# Patient Record
Sex: Male | Born: 1985 | Race: White | Hispanic: No | Marital: Married
Health system: Southern US, Community
[De-identification: ages and names within clinical notes are randomized; demographics above are authoritative.]

## PROBLEM LIST (undated history)

## (undated) DIAGNOSIS — E119 Type 2 diabetes mellitus without complications: Secondary | ICD-10-CM

---

## 2022-03-03 ENCOUNTER — Encounter (HOSPITAL_COMMUNITY): Payer: Self-pay

## 2022-03-03 ENCOUNTER — Other Ambulatory Visit: Payer: Self-pay

## 2022-03-03 ENCOUNTER — Emergency Department (HOSPITAL_COMMUNITY): Payer: 59

## 2022-03-03 ENCOUNTER — Emergency Department (HOSPITAL_COMMUNITY)
Admission: EM | Admit: 2022-03-03 | Discharge: 2022-03-03 | Disposition: A | Discharge status: Home / Self Care | Payer: 59 | Attending: Emergency Medicine | Admitting: Emergency Medicine

## 2022-03-03 DIAGNOSIS — N200 Calculus of kidney: Secondary | ICD-10-CM | POA: Diagnosis not present

## 2022-03-03 DIAGNOSIS — E119 Type 2 diabetes mellitus without complications: Secondary | ICD-10-CM | POA: Diagnosis not present

## 2022-03-03 DIAGNOSIS — R1031 Right lower quadrant pain: Secondary | ICD-10-CM | POA: Diagnosis present

## 2022-03-03 HISTORY — DX: Type 2 diabetes mellitus without complications: E11.9

## 2022-03-03 LAB — CBC WITH DIFFERENTIAL/PLATELET
Abs Immature Granulocytes: 0.02 10*3/uL (ref 0.00–0.07)
Basophils Absolute: 0 10*3/uL (ref 0.0–0.1)
Basophils Relative: 0 %
Eosinophils Absolute: 0.2 10*3/uL (ref 0.0–0.5)
Eosinophils Relative: 2 %
HCT: 45.4 % (ref 39.0–52.0)
Hemoglobin: 16.4 g/dL (ref 13.0–17.0)
Immature Granulocytes: 0 %
Lymphocytes Relative: 41 %
Lymphs Abs: 3.7 10*3/uL (ref 0.7–4.0)
MCH: 30.8 pg (ref 26.0–34.0)
MCHC: 36.1 g/dL — ABNORMAL HIGH (ref 30.0–36.0)
MCV: 85.2 fL (ref 80.0–100.0)
Monocytes Absolute: 0.8 10*3/uL (ref 0.1–1.0)
Monocytes Relative: 9 %
Neutro Abs: 4.3 10*3/uL (ref 1.7–7.7)
Neutrophils Relative %: 48 %
Platelets: 237 10*3/uL (ref 150–400)
RBC: 5.33 MIL/uL (ref 4.22–5.81)
RDW: 12.1 % (ref 11.5–15.5)
WBC: 9.1 10*3/uL (ref 4.0–10.5)
nRBC: 0 % (ref 0.0–0.2)

## 2022-03-03 LAB — COMPREHENSIVE METABOLIC PANEL
ALT: 62 U/L — ABNORMAL HIGH (ref 0–44)
AST: 44 U/L — ABNORMAL HIGH (ref 15–41)
Albumin: 4.4 g/dL (ref 3.5–5.0)
Alkaline Phosphatase: 74 U/L (ref 38–126)
Anion gap: 9 (ref 5–15)
BUN: 14 mg/dL (ref 6–20)
CO2: 25 mmol/L (ref 22–32)
Calcium: 9.5 mg/dL (ref 8.9–10.3)
Chloride: 107 mmol/L (ref 98–111)
Creatinine, Ser: 0.9 mg/dL (ref 0.61–1.24)
GFR, Estimated: >60 mL/min (ref >60)
Glucose, Bld: 171 mg/dL — ABNORMAL HIGH (ref 70–99)
Potassium: 4.1 mmol/L (ref 3.5–5.1)
Sodium: 141 mmol/L (ref 135–145)
Total Bilirubin: 0.8 mg/dL (ref 0.3–1.2)
Total Protein: 7.4 g/dL (ref 6.5–8.1)

## 2022-03-03 LAB — URINALYSIS, ROUTINE W REFLEX MICROSCOPIC
Bilirubin Urine: NEGATIVE
Glucose, UA: NEGATIVE mg/dL
Ketones, ur: NEGATIVE mg/dL
Leukocytes,Ua: NEGATIVE
Nitrite: NEGATIVE
Protein, ur: NEGATIVE mg/dL
Specific Gravity, Urine: 1.019 (ref 1.005–1.030)
pH: 5 (ref 5.0–8.0)

## 2022-03-03 LAB — LIPASE, BLOOD: Lipase: 44 U/L (ref 11–51)

## 2022-03-03 MED ORDER — ONDANSETRON HCL 4 MG/2ML IJ SOLN
4.0000 mg | Freq: Once | INTRAMUSCULAR | Status: AC
  Administered 2022-03-03: 4 mg via INTRAVENOUS
  Filled 2022-03-03: qty 2

## 2022-03-03 MED ORDER — HYDROMORPHONE HCL 1 MG/ML IJ SOLN
1.0000 mg | Freq: Once | INTRAMUSCULAR | Status: AC
  Administered 2022-03-03: 1 mg via INTRAVENOUS
  Filled 2022-03-03: qty 1

## 2022-03-03 MED ORDER — OXYCODONE-ACETAMINOPHEN 5-325 MG PO TABS
1.0000 | ORAL_TABLET | Freq: Four times a day (QID) | ORAL | 0 refills | Status: AC | PRN
Start: 2022-03-03 — End: ?

## 2022-03-03 MED ORDER — TAMSULOSIN HCL 0.4 MG PO CAPS: 0.4000 mg | ORAL_CAPSULE | Freq: Every day | ORAL | 0 refills | Status: AC

## 2022-03-03 MED ORDER — KETOROLAC TROMETHAMINE 15 MG/ML IJ SOLN
15.0000 mg | Freq: Once | INTRAMUSCULAR | Status: AC
  Administered 2022-03-03: 15 mg via INTRAVENOUS
  Filled 2022-03-03: qty 1

## 2022-03-03 MED ORDER — SODIUM CHLORIDE 0.9 % IV BOLUS
1000.0000 mL | Freq: Once | INTRAVENOUS | Status: AC
  Administered 2022-03-03: 1000 mL via INTRAVENOUS

## 2022-03-03 NOTE — Discharge Instructions (Signed)
You have a 3 mm stone in your right ureter.  Drink plenty of fluids.  Please call urology for follow-up in the next week.   ? ?Get rechecked immediately if you develop fevers, inability to urinate, uncontrolled pain or severe pain on both sides. ?

## 2022-03-03 NOTE — ED Triage Notes (Signed)
Patient having lower right abdominal pain radiating to his back. Vomiting along with it.  ?

## 2022-03-03 NOTE — ED Notes (Signed)
Pt states understanding of dc instructions, importance of follow up, and prescriptions. Pt denies questions or concerns upon dc. Pt declined wheelchair assistance upon dc. Pt ambulated out of ed w/ steady gait. No belongings left in room upon dc.  

## 2022-03-03 NOTE — ED Provider Notes (Signed)
?[facility]-EMERGENCY DEPT ?Provider Note ? ? ?CSN: [id number] ?Arrival date & time: 03/03/22  0342 ? ?  ? ?History ? ?Chief Complaint  ?Patient presents with  ? Abdominal Pain  ? ? ?Thomas Richmond is a 36 y.o. male. ? ?The history is provided by the patient.  ?Abdominal Pain ?Thomas ServeMatthew Richmond is a 36 y.o. male who presents to the Emergency Department complaining of abdominal pain.  He presents to the ED for evaluation of sudden onset severe, constant RLQ abdominal pain that started just prior to ED arrival.  Pain radiates to his low back and he has associated N/V. No fevers. No prior similar sxs.  Has a hx/o DM.   ?  ? ?Home Medications ?Prior to Admission medications   ?Medication Sig Start Date End Date Taking? Authorizing Provider  ?oxyCODONE-acetaminophen (PERCOCET/ROXICET) 5-325 MG tablet Take 1 tablet by mouth every 6 (six) hours as needed for severe pain. 03/03/22  Yes Tilden Fossaees, Custer Pimenta, MD  ?tamsulosin (FLOMAX) 0.4 MG CAPS capsule Take 1 capsule (0.4 mg total) by mouth daily. 03/03/22  Yes Tilden Fossaees, Larinda Herter, MD  ?   ? ?Allergies    ?Patient has no known allergies.   ? ?Review of Systems   ?Review of Systems  ?Gastrointestinal:  Positive for abdominal pain.  ?All other systems reviewed and are negative. ? ?Physical Exam ?Updated Vital Signs ?BP 123/68   Pulse 80   Temp (!) 97.2 ?F (36.2 ?C) (Oral)   Resp 18   Ht 5\' 9"  (1.753 m)   Wt 118.4 kg   SpO2 92%   BMI 38.54 kg/m?  ?Physical Exam ?Vitals and nursing note reviewed.  ?Constitutional:   ?   General: He is in acute distress.  ?   Appearance: He is well-developed.  ?HENT:  ?   Head: Normocephalic and atraumatic.  ?Cardiovascular:  ?   Rate and Rhythm: Normal rate and regular rhythm.  ?Pulmonary:  ?   Effort: Pulmonary effort is normal. No respiratory distress.  ?Abdominal:  ?   Palpations: Abdomen is soft.  ?   Tenderness: There is no abdominal tenderness. There is no guarding or rebound.  ?Musculoskeletal:     ?   General: No tenderness.   ?Skin: ?   General: Skin is warm and dry.  ?Neurological:  ?   Mental Status: He is alert and oriented to person, place, and time.  ?Psychiatric:     ?   Behavior: Behavior normal.  ? ? ?ED Results / Procedures / Treatments   ?Labs ?(all labs ordered are listed, but only abnormal results are displayed) ?Labs Reviewed  ?COMPREHENSIVE METABOLIC PANEL - Abnormal; Notable for the following components:  ?    Result Value  ? Glucose, Bld 171 (*)   ? AST 44 (*)   ? ALT 62 (*)   ? All other components within normal limits  ?CBC WITH DIFFERENTIAL/PLATELET - Abnormal; Notable for the following components:  ? MCHC 36.1 (*)   ? All other components within normal limits  ?URINALYSIS, ROUTINE W REFLEX MICROSCOPIC - Abnormal; Notable for the following components:  ? Hgb urine dipstick LARGE (*)   ? Bacteria, UA RARE (*)   ? All other components within normal limits  ?LIPASE, BLOOD  ? ? ?EKG ?None ? ?Radiology ?CT Renal Stone Study ? ?Result Date: 03/03/2022 ?CLINICAL DATA:  36 year old male with history of left-sided flank pain. EXAM: CT ABDOMEN AND PELVIS WITHOUT CONTRAST TECHNIQUE: Multidetector CT imaging of the abdomen and pelvis was performed following  the standard protocol without IV contrast. RADIATION DOSE REDUCTION: This exam was performed according to the departmental dose-optimization program which includes automated exposure control, adjustment of the mA and/or kV according to patient size and/or use of iterative reconstruction technique. COMPARISON:  No priors. FINDINGS: Lower chest: Unremarkable. Hepatobiliary: Diffuse low attenuation throughout the hepatic parenchyma, indicative of a background of hepatic steatosis. No suspicious cystic or solid hepatic lesions are confidently identified on today's noncontrast CT examination. Unenhanced appearance of the gallbladder is normal. Pancreas: No definite pancreatic mass or peripancreatic fluid collections or inflammatory changes are noted on today's noncontrast CT  examination. Spleen: Unremarkable. Adrenals/Urinary Tract: Multiple tiny nonobstructive calculi are noted within the collecting systems of both kidneys measuring 4 mm or less in size. In addition, in the distal third of the right ureter (axial image 79 of series 2) shortly before the right ureterovesicular junction there is a 3 mm calculus. This is associated with only minimal fullness of the proximal right ureter, without frank hydroureteronephrosis. Unenhanced appearance of the kidneys, bilateral adrenal glands and urinary bladder is otherwise normal. Stomach/Bowel: Unenhanced appearance of the stomach is normal. There is no pathologic dilatation of small bowel or colon. Normal appendix. Vascular/Lymphatic: No atherosclerotic calcifications are noted in the abdominal aorta or pelvic vasculature. No lymphadenopathy noted in the abdomen or pelvis. Reproductive: Prostate gland and seminal vesicles are unremarkable in appearance. Other: No significant volume of ascites.  No pneumoperitoneum. Musculoskeletal: There are no aggressive appearing lytic or blastic lesions noted in the visualized portions of the skeleton. IMPRESSION: 1. 3 mm calculus in the distal third of the right ureter shortly before the right ureterovesicular junction with only mild proximal fullness in the right ureter, indicating very mild obstruction at this time. 2. Multiple nonobstructive calculi measuring 4 mm or less in size in the collecting systems of both kidneys. 3. Hepatic steatosis. Electronically Signed   By: Trudie Reed M.D.   On: 03/03/2022 05:50   ? ?Procedures ?Procedures  ? ? ?Medications Ordered in ED ?Medications  ?sodium chloride 0.9 % bolus 1,000 mL (0 mLs Intravenous Stopped 03/03/22 0631)  ?ketorolac (TORADOL) 15 MG/ML injection 15 mg (15 mg Intravenous Given 03/03/22 0415)  ?ondansetron [facility]) injection 4 mg (4 mg Intravenous Given 03/03/22 0414)  ?HYDROmorphone (DILAUDID) injection 1 mg (1 mg Intravenous Given 03/03/22 0418)   ?HYDROmorphone (DILAUDID) injection 1 mg (1 mg Intravenous Given 03/03/22 0531)  ? ? ?ED Course/ Medical Decision Making/ A&P ?  ?                        ?Medical Decision Making ?Amount and/or Complexity of Data Reviewed ?Labs: ordered. ?Radiology: ordered. ? ?Risk ?Prescription drug management. ? ? ?Patient here for evaluation of acute onset right lower quadrant pain rating to the back.  Patient in distress on ED arrival, appears severely uncomfortable.  He was treated with Toradol for possible renal colic without significant improvement in his pain.  He was also treated with multiple doses of Dilaudid.  Labs with normal renal function.  UA not consistent with UTI.  CT significant for right ureteral stone as well as multiple nonobstructing stones bilaterally.  On reassessment after medications in the emergency department patient's pain is controlled and he is feeling improved.  Discussed findings of studies.  Discussed home care for renal colic.  Discussed urology follow-up as well as return precautions. ? ?Doubt acute infection, sepsis, torsion, dissection. ? ? ? ? ? ? ? ?Final Clinical Impression(s) /  ED Diagnoses ?Final diagnoses:  ?Kidney stone on right side  ? ? ?Rx / DC Orders ?ED Discharge Orders   ? ?      Ordered  ?  tamsulosin (FLOMAX) 0.4 MG CAPS capsule  Daily       ? 03/03/22 0629  ?  oxyCODONE-acetaminophen (PERCOCET/ROXICET) 5-325 MG tablet  Every 6 hours PRN       ? 03/03/22 4818  ? ?  ?  ? ?  ? ? ?  ?Tilden Fossa, MD ?03/03/22 [phone] ? ?

## 2023-03-02 IMAGING — CT CT RENAL STONE PROTOCOL
2 of 4 series · 16 of 46 positions shown, 18 images · non-contrast
Comparison: No priors.

CLINICAL DATA: 35-year-old male with history of left-sided flank
pain.



[Series 2: axial st · axial · 0.83mm/px · z∈[+1104,+1529]mm · 13 of 97 slices shown, 15 images]
[im 6/97  soft-tissue]
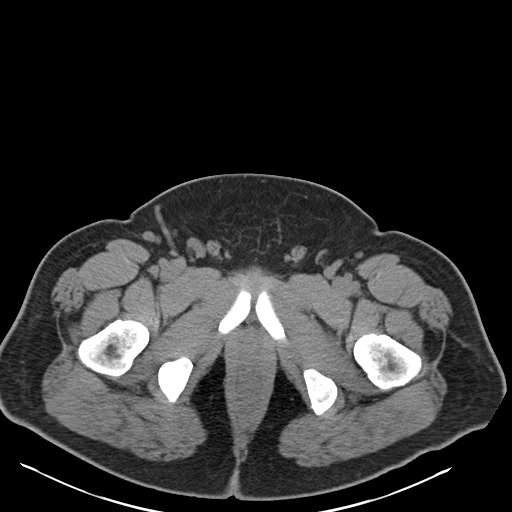
[im 6/97  bone]
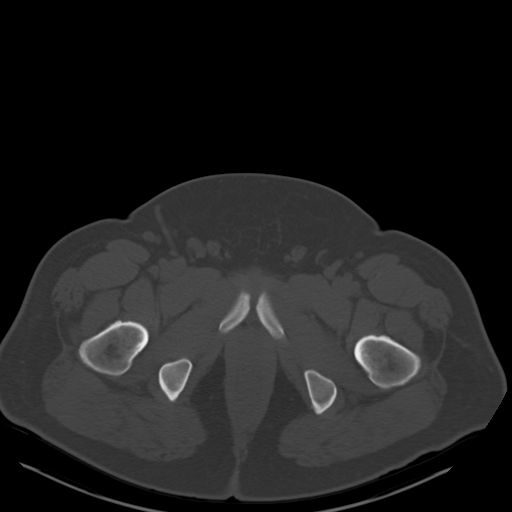
[im 16/97  soft-tissue]
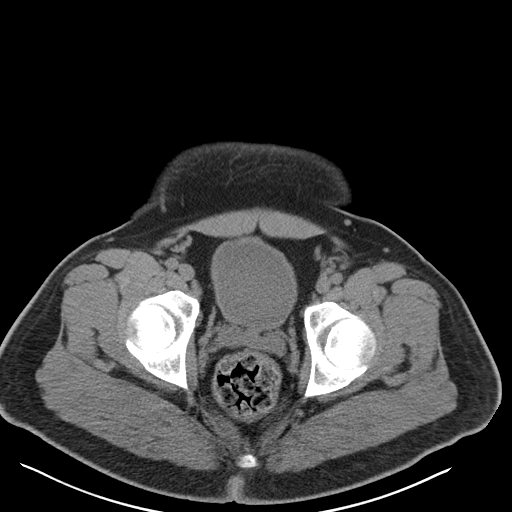
[im 21/97  soft-tissue]
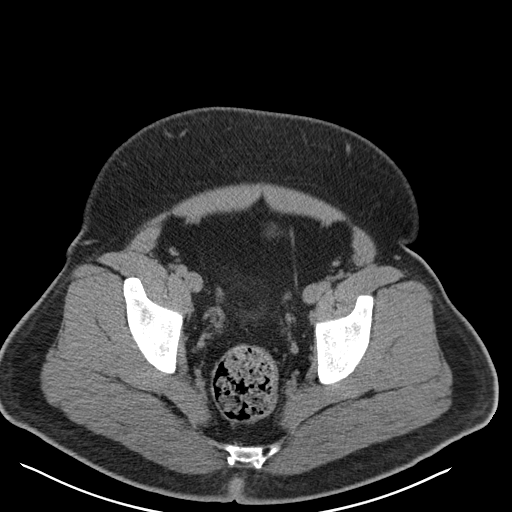
[im 26/97  soft-tissue]
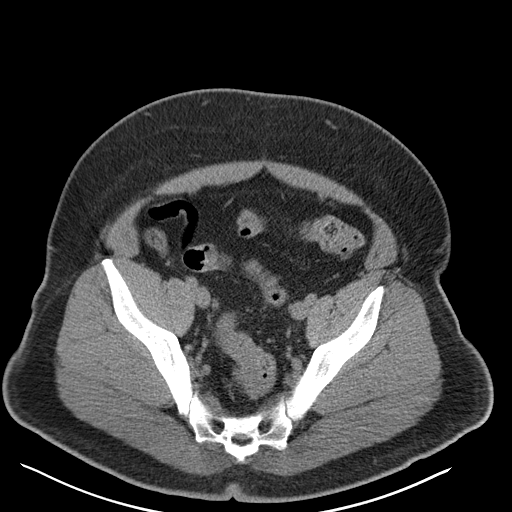
[im 36/97  soft-tissue]
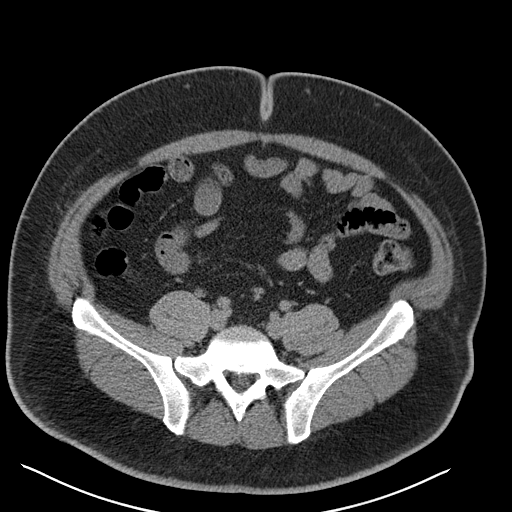
[im 41/97  soft-tissue]
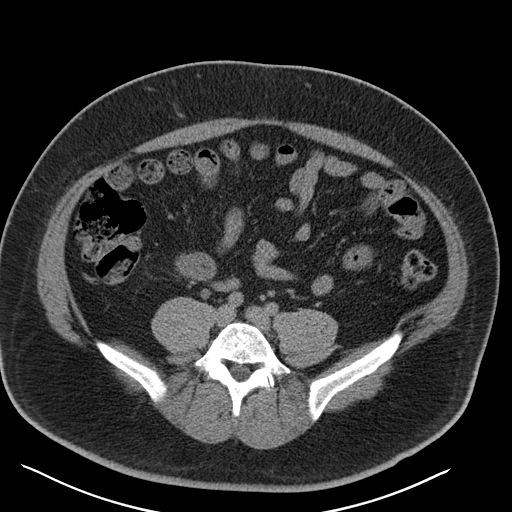
[im 51/97  soft-tissue]
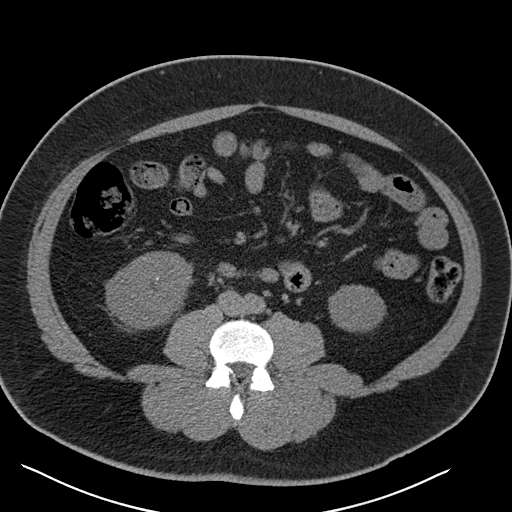
[im 56/97  soft-tissue]
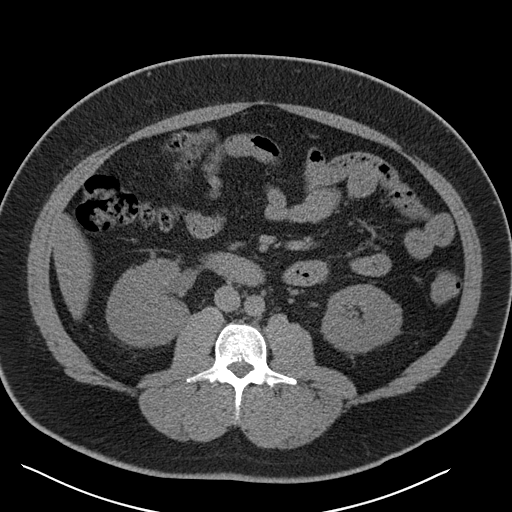
[im 61/97  soft-tissue]
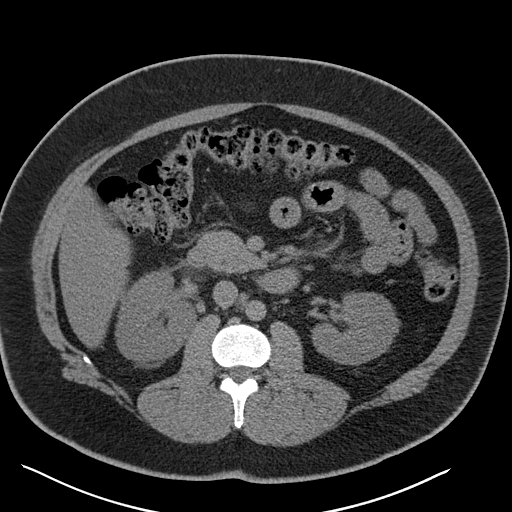
[im 61/97  bone]
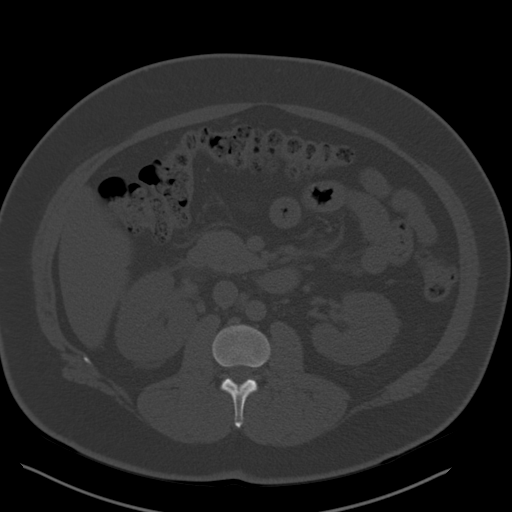
[im 71/97  soft-tissue]
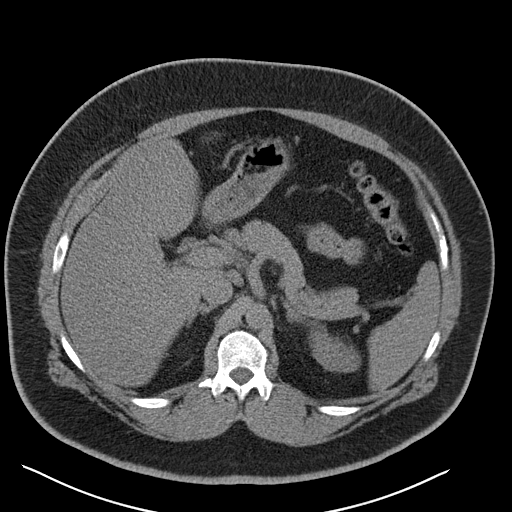
[im 76/97  soft-tissue]
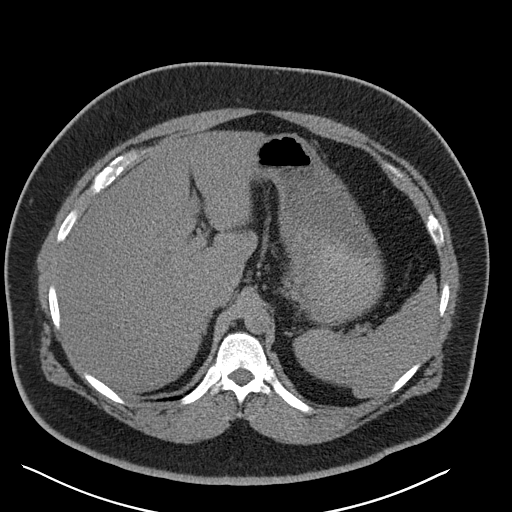
[im 81/97  soft-tissue]
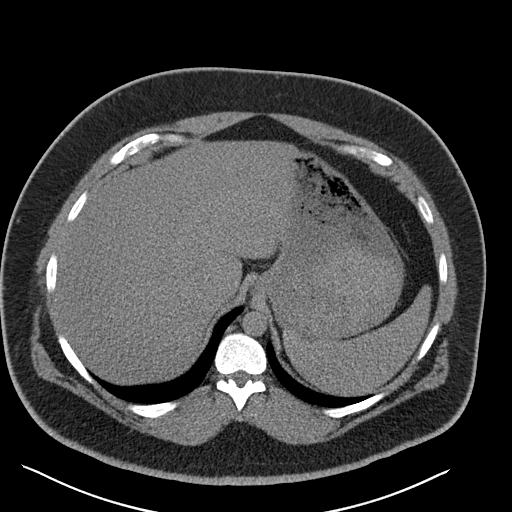
[im 91/97  soft-tissue]
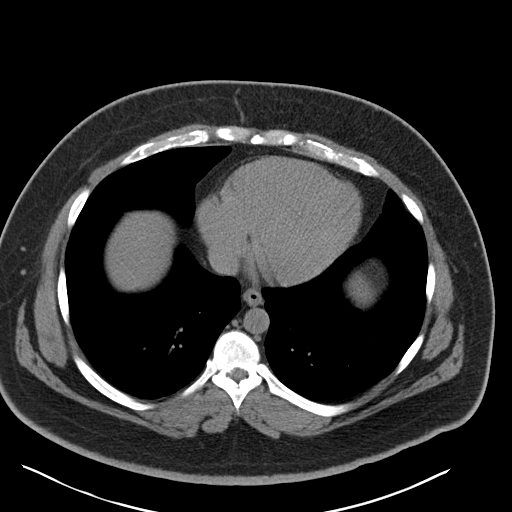

[Series 4: coronal · coronal · 0.81mm/px · 3 of 178 slices shown]
[im 60/178  soft-tissue]
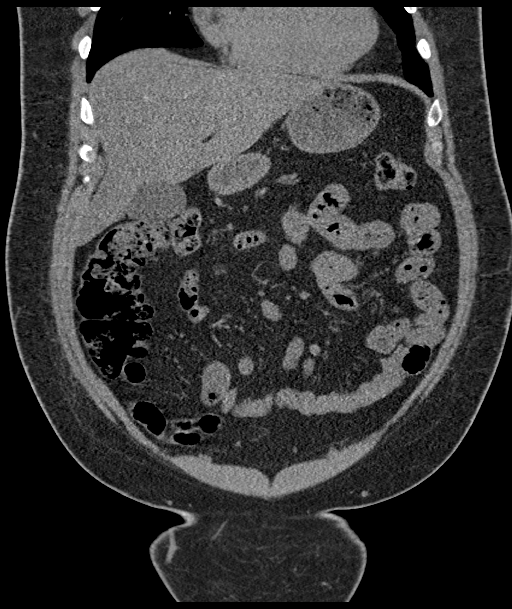
[im 79/178  soft-tissue]
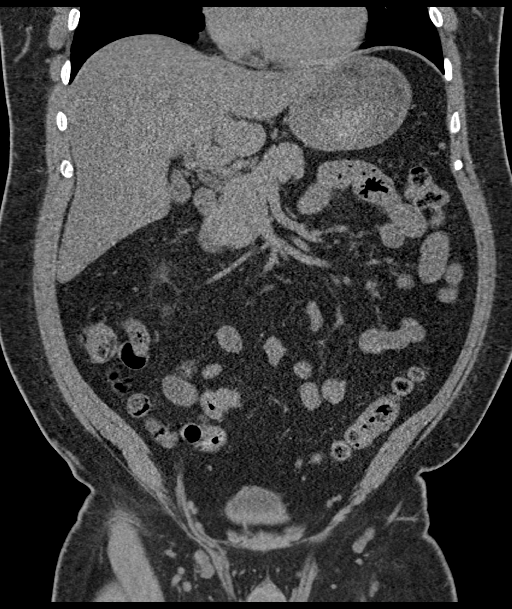
[im 99/178  soft-tissue]
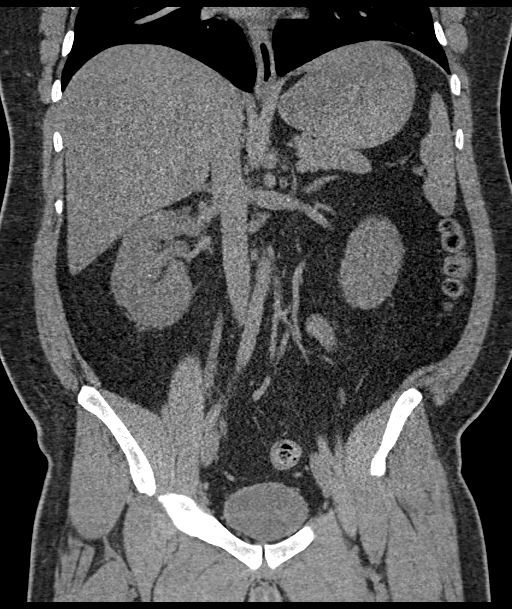

[16 of 46 positions shown; findings below may reference images not displayed]

FINDINGS: Lower chest: Unremarkable.

Hepatobiliary: Diffuse low attenuation throughout the hepatic
parenchyma, indicative of a background of hepatic steatosis. No
suspicious cystic or solid hepatic lesions are confidently
identified on today's noncontrast CT examination. Unenhanced
appearance of the gallbladder is normal.

Pancreas: No definite pancreatic mass or peripancreatic fluid
collections or inflammatory changes are noted on today's noncontrast
CT examination.

Spleen: Unremarkable.

Adrenals/Urinary Tract: Multiple tiny nonobstructive calculi are
noted within the collecting systems of both kidneys measuring 4 mm
or less in size. In addition, in the distal third of the right
ureter (axial image 79 of series 2) shortly before the right
ureterovesicular junction there is a 3 mm calculus. This is
associated with only minimal fullness of the proximal right ureter,
without frank hydroureteronephrosis. Unenhanced appearance of the
kidneys, bilateral adrenal glands and urinary bladder is otherwise
normal.

Stomach/Bowel: Unenhanced appearance of the stomach is normal. There
is no pathologic dilatation of small bowel or colon. Normal
appendix.

Vascular/Lymphatic: No atherosclerotic calcifications are noted in
the abdominal aorta or pelvic vasculature. No lymphadenopathy noted
in the abdomen or pelvis.

Reproductive: Prostate gland and seminal vesicles are unremarkable
in appearance.

Other: No significant volume of ascites.  No pneumoperitoneum.

Musculoskeletal: There are no aggressive appearing lytic or blastic
lesions noted in the visualized portions of the skeleton.
IMPRESSION: 1. 3 mm calculus in the distal third of the right ureter shortly
before the right ureterovesicular junction with only mild proximal
fullness in the right ureter, indicating very mild obstruction at
this time.
2. Multiple nonobstructive calculi measuring 4 mm or less in size in
the collecting systems of both kidneys.
3. Hepatic steatosis.
